# Patient Record
Sex: Male | Born: 1995 | Race: White | Hispanic: No | Marital: Single | Smoking: Never smoker
Health system: Southern US, Community
[De-identification: ages and names within clinical notes are randomized; demographics above are authoritative.]

---
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
## 2013-06-20 ENCOUNTER — Emergency Department
Admission: EM | Admit: 2013-06-20 | Discharge: 2013-06-20 | Disposition: A | Discharge status: Home / Self Care | Payer: BC Managed Care – PPO | Source: Home / Self Care | Attending: Family Medicine | Admitting: Family Medicine

## 2013-06-20 ENCOUNTER — Emergency Department (INDEPENDENT_AMBULATORY_CARE_PROVIDER_SITE_OTHER): Payer: BC Managed Care – PPO

## 2013-06-20 ENCOUNTER — Encounter: Payer: Self-pay | Admitting: Emergency Medicine

## 2013-06-20 ENCOUNTER — Ambulatory Visit (INDEPENDENT_AMBULATORY_CARE_PROVIDER_SITE_OTHER): Payer: BC Managed Care – PPO | Admitting: Sports Medicine

## 2013-06-20 DIAGNOSIS — S62304A Unspecified fracture of fourth metacarpal bone, right hand, initial encounter for closed fracture: Secondary | ICD-10-CM

## 2013-06-20 DIAGNOSIS — S62309A Unspecified fracture of unspecified metacarpal bone, initial encounter for closed fracture: Secondary | ICD-10-CM

## 2013-06-20 DIAGNOSIS — S62306A Unspecified fracture of fifth metacarpal bone, right hand, initial encounter for closed fracture: Secondary | ICD-10-CM | POA: Insufficient documentation

## 2013-06-20 MED ORDER — HYDROCODONE-ACETAMINOPHEN 10-325 MG PO TABS: 1.0000 | ORAL_TABLET | Freq: Three times a day (TID) | ORAL | Status: DC | PRN

## 2013-06-20 NOTE — ED Notes (Signed)
Rt hand injury yesterday slammed in a door 7/10 intermittent

## 2013-06-20 NOTE — Assessment & Plan Note (Signed)
Reduced under hematoma block. Ulnar gutter splint placed. Hydrocodone as needed for pain. Return on Monday, x-ray before visit.  I billed a fracture code for this visit, all subsequent visits for this complaint will be "post-op checks" in the global period.

## 2013-06-20 NOTE — Progress Notes (Signed)
   Subjective:    I'm seeing this patient as a consultation for:  Dr. BeCathren Harshese  CC: Hand fracture  HPI: This is a pleasant 18 year old male, earlier today he was at shop class, close his hand in a car door, resulting in immediate exquisite pain, swelling, and bruising. He was seen in urgent care where x-rays showed a angulated, displaced fracture of both the fourth and fifth metacarpals. I was called for further evaluation and treatment.  Past medical history, Surgical history, Family history not pertinant except as noted below, Social history, Allergies, and medications have been entered into the medical record, reviewed, and no changes needed.   Review of Systems: No headache, visual changes, nausea, vomiting, diarrhea, constipation, dizziness, abdominal pain, skin rash, fevers, chills, night sweats, weight loss, swollen lymph nodes, body aches, joint swelling, muscle aches, chest pain, shortness of breath, mood changes, visual or auditory hallucinations.   Objective:   General: Well Developed, well nourished, and in no acute distress.  Neuro/Psych: Alert and oriented x3, extra-ocular muscles intact, able to move all 4 extremities, sensation grossly intact. Skin: Warm and dry, no rashes noted.  Respiratory: Not using accessory muscles, speaking in full sentences, trachea midline.  Cardiovascular: Pulses palpable, no extremity edema. Abdomen: Does not appear distended. Right hand: There is diffuse swelling and bruising, there is discrete tenderness to palpation over the fractures. He is neurovascularly intact distally.  Procedure:  Fracture reduction of both the fourth and fifth metacarpal fractures. Risks, benefits, and alternatives explained and consent obtained. Time out conducted. Surface cleaned with alcohol. 2.5 cc lidocaine, 2.5 cc Marcaine infiltrated around both the fourth and fifth metacarpal fractures in a hematoma block, this was done under ultrasound guidance. Adequate  anesthesia ensured. Fracture reduction procedure: We first produced the fifth metacarpal fracture, I reproduced increased angulation of the fracture, after which I plate axial traction, and then a forced directives from the dorsal metacarpal. Occult fracture slipped back into place. Attention was then directed to the fourth metacarpal fracture, again, I increased the angulation of the fracture, applied axial traction, and then apply at dorsal force and felt the fracture slip back into place. I then applied an ulnar gutter splint. Pt stable. Postreduction films reviewed and noted improved/near anatomic alignment. Aftercare and follow-up advised.  Impression and Recommendations:   This case required medical decision making of moderate complexity.

## 2013-06-20 NOTE — ED Provider Notes (Signed)
CSN: [id number]     Arrival date & time 06/20/13  1737 History   First MD Initiated Contact with Patient 06/20/13 1759     Chief Complaint  Patient presents with  . Hand Injury     HPI Comments: Patient was in a shop class today when a car door slammed on his right hand.  He has had persistent pain/swelling in dorsum of hand.  Patient is a 18 y.o. male presenting with hand injury. The history is provided by the patient and a parent.  Hand Injury Location:  Hand Time since incident:  7 hours Injury: yes   Mechanism of injury: crush   Crush injury:    Mechanism:  Door   Duration of crushing force: brief. Hand location:  R hand Pain details:    Quality:  Aching   Radiates to:  Does not radiate   Severity:  Moderate   Onset quality:  Sudden   Duration:  7 hours   Timing:  Constant   Progression:  Unchanged Chronicity:  New Handedness:  Right-handed Dislocation: no   Foreign body present:  No foreign bodies Prior injury to area:  No Relieved by:  Nothing Worsened by:  Movement Ineffective treatments:  Ice Associated symptoms: decreased range of motion, stiffness and swelling   Associated symptoms: no muscle weakness, no numbness and no tingling     History reviewed. No pertinent past medical history. History reviewed. No pertinent past surgical history. No family history on file. History  Substance Use Topics  . Smoking status: Never Smoker   . Smokeless tobacco: Not on file  . Alcohol Use: No    Review of Systems  Musculoskeletal: Positive for stiffness.  All other systems reviewed and are negative.   Allergies  Review of patient's allergies indicates no known allergies.  Home Medications   Prior to Admission medications   Medication Sig Start Date End Date Taking? Authorizing Provider  acetaminophen (TYLENOL) 325 MG tablet Take 650 mg by mouth every 6 (six) hours as needed.   Yes Historical Provider, MD  ibuprofen (ADVIL,MOTRIN) 200 MG tablet Take 200 mg by  mouth every 6 (six) hours as needed.   Yes Historical Provider, MD   BP 118/77  Pulse 88  Temp(Src) 98 F (36.7 C) (Oral)  Ht 5\' 10"  (1.778 m)  Wt 146 lb (66.225 kg)  BMI 20.95 kg/m2  SpO2 99% Physical Exam  Nursing note and vitals reviewed. Constitutional: He is oriented to person, place, and time. He appears well-developed and well-nourished. No distress.  HENT:  Head: Atraumatic.  Eyes: Conjunctivae are normal. Pupils are equal, round, and reactive to light.  Musculoskeletal:       Right hand: He exhibits decreased range of motion, tenderness, bony tenderness and swelling. He exhibits normal two-point discrimination, normal capillary refill, no deformity and no laceration. Normal sensation noted.       Hands: Swelling dorsum of hand with maximal tenderness over 4th and 5th metacarpals.  Decreased range of motion fingers but flexion/extension is intact.  Distal neurovascular function is intact.   Neurological: He is alert and oriented to person, place, and time.  Skin: Skin is warm and dry.    ED Course  Procedures  none     Imaging Review Dg Hand Complete Right  06/20/2013   CLINICAL DATA:  Slammed right hand into a car door yesterday, pain at fourth and fifth metacarpals  EXAM: RIGHT HAND - COMPLETE 3+ VIEW  COMPARISON:  None  FINDINGS: Osseous mineralization  normal.  Joint spaces preserved.  Transverse fracture middle third of the fourth metacarpal, minimally displaced ulnar and dorsal with apex dorsal and ulnar angulation.  Displaced distal fifth metacarpal fracture with apex dorsal and ulnar angulation.  No intra or articular extension of either fracture.  Overlying soft tissue swelling.  No additional fracture, dislocation, or bone destruction.  IMPRESSION: Displaced right fourth and fifth metacarpal fractures as above.   Electronically Signed   By: Ulyses SouthwardMark  Boles M.D.   On: 06/20/2013 18:51     MDM   1. Closed fracture of fourth metacarpal bone of right hand   2. Closed  fracture of fifth metacarpal bone of right hand    Patient referred to Dr. Rodney Langtonhomas Thekkekandam for management and follow-up     Lattie HawStephen A Beese, MD 06/20/13 [id number]

## 2013-06-20 NOTE — Discharge Instructions (Signed)
Closed Reduction for Metacarpal Fracture or Dislocation Care After Refer to this sheet in the next few weeks. These instructions provide you with information on caring for yourself after your procedure. Your caregiver may also give you more specific instructions. Your treatment has been planned according to current medical practices, but problems sometimes occur. Call your caregiver if you have any problems or questions after your procedure. HOME CARE INSTRUCTIONS   Only take medicines as directed by your caregiver.  Keep the injured area elevated above the level of your heart while you are sleeping or sitting down.  Apply ice to the injured area, twice per day, for 2 3 days:  Put ice in a plastic bag.  Place a towel between your skin and the bag.  Leave the ice on for 15 minutes.  Keep your splint clean and dry. Cover your splint with a bag while you shower. SEEK MEDICAL CARE IF:  Your pain does not get better or gets worse.  You develop numbness or tingling.  Your fingers turn white or bluish. MAKE SURE YOU:  Understand these instructions.  Will watch your condition.  Will get help right away if you are not doing well or get worse. Document Released: 11/08/2011 Document Revised: 06/10/2012 Document Reviewed: 11/08/2011 [facility] Patient Information 2014 [place], [place].

## 2013-06-20 NOTE — Assessment & Plan Note (Addendum)
Reduced under hematoma block. Ulnar gutter splint placed. Hydrocodone as needed for pain. Return on Monday, x-ray before visit.  I billed a fracture code for this visit, all subsequent visits for this complaint will be "post-op checks" in the global period. 

## 2013-06-23 ENCOUNTER — Encounter: Payer: Self-pay | Admitting: Sports Medicine

## 2013-06-23 ENCOUNTER — Ambulatory Visit (INDEPENDENT_AMBULATORY_CARE_PROVIDER_SITE_OTHER): Payer: BC Managed Care – PPO | Admitting: Sports Medicine

## 2013-06-23 ENCOUNTER — Ambulatory Visit (INDEPENDENT_AMBULATORY_CARE_PROVIDER_SITE_OTHER): Payer: BC Managed Care – PPO

## 2013-06-23 VITALS — BP 132/87 | HR 120 | Ht 71.0 in | Wt 146.0 lb

## 2013-06-23 DIAGNOSIS — S62309A Unspecified fracture of unspecified metacarpal bone, initial encounter for closed fracture: Secondary | ICD-10-CM

## 2013-06-23 DIAGNOSIS — S62306A Unspecified fracture of fifth metacarpal bone, right hand, initial encounter for closed fracture: Secondary | ICD-10-CM

## 2013-06-23 DIAGNOSIS — S62304A Unspecified fracture of fourth metacarpal bone, right hand, initial encounter for closed fracture: Secondary | ICD-10-CM

## 2013-06-23 DIAGNOSIS — IMO0001 Reserved for inherently not codable concepts without codable children: Secondary | ICD-10-CM

## 2013-06-23 NOTE — Progress Notes (Signed)
  Subjective: Followup fourth and fifth metacarpal fractures, I performed a closed reduction 2 days ago. Narcotics controlled his pain well.   Objective: General: Well-developed, well-nourished, and in no acute distress.  Repeat x-rays did show loss of reduction.  Procedure:  Fracture reduction. Risks, benefits, and alternatives explained and consent obtained. Time out conducted. Surface cleaned with alcohol. 5cc lidocaine infiltrated around fracture in a hematoma block. Adequate anesthesia ensured. Fracture reduction procedure: Again, I applied axial traction to the fourth metacarpal head, I then applied a palmar directed force to reduce the fracture, this was repeated at the fifth metacarpal neck, an ulnar gutter splint was then applied Pt stable. Postreduction films reviewed and noted improved/near anatomic alignment. Aftercare and follow-up advised.  Assessment/plan:

## 2013-06-23 NOTE — Assessment & Plan Note (Addendum)
Unfortunately there has been a slight loss of reduction in the fracture, repeat reduction and ulnar gutter splint. Repeat x-rays today. Return on Wednesday for repeat x-rays. If continued loss of reduction we will refer for ORIF.

## 2013-06-23 NOTE — Assessment & Plan Note (Addendum)
Unfortunately there has been a slight loss of reduction in the fracture, repeat reduction and ulnar gutter splint. Repeat x-rays after second reduction today. Return on Wednesday for repeat x-rays. If continued loss of reduction we will refer for ORIF.

## 2013-06-25 ENCOUNTER — Encounter: Payer: Self-pay | Admitting: Sports Medicine

## 2013-06-25 ENCOUNTER — Ambulatory Visit (INDEPENDENT_AMBULATORY_CARE_PROVIDER_SITE_OTHER): Payer: BC Managed Care – PPO | Admitting: Sports Medicine

## 2013-06-25 ENCOUNTER — Ambulatory Visit (INDEPENDENT_AMBULATORY_CARE_PROVIDER_SITE_OTHER): Payer: BC Managed Care – PPO

## 2013-06-25 VITALS — BP 144/93 | HR 109 | Wt 146.0 lb

## 2013-06-25 DIAGNOSIS — IMO0001 Reserved for inherently not codable concepts without codable children: Secondary | ICD-10-CM

## 2013-06-25 DIAGNOSIS — S62306A Unspecified fracture of fifth metacarpal bone, right hand, initial encounter for closed fracture: Secondary | ICD-10-CM

## 2013-06-25 DIAGNOSIS — S62309A Unspecified fracture of unspecified metacarpal bone, initial encounter for closed fracture: Secondary | ICD-10-CM

## 2013-06-25 DIAGNOSIS — S62304A Unspecified fracture of fourth metacarpal bone, right hand, initial encounter for closed fracture: Secondary | ICD-10-CM

## 2013-06-25 NOTE — Progress Notes (Signed)
  Subjective: Returns a few days post fracture, pain-free.   Objective: General: Well-developed, well-nourished, and in no acute distress. Right hand: Splint was left on, he is neurovascularly intact distally.  X-rays are reviewed, there is continued stability of the fractures, there is approximately 30 of residual angulation of the fifth metacarpal neck, and 20 of residual angulation of the fourth metacarpal shaft.  Assessment/plan:

## 2013-06-25 NOTE — Assessment & Plan Note (Signed)
5 days post fracture. Reduction is maintained. Still too much swelling to place a cast. Pain control is good. Return to see me in a week and a half. X-rays before visit.

## 2013-06-25 NOTE — Assessment & Plan Note (Signed)
5 days post fracture. Reduction is maintained. Still too much swelling to place a cast. Pain control is good. Return to see me in a week and a half. X-rays before visit.  

## 2013-07-07 ENCOUNTER — Ambulatory Visit (INDEPENDENT_AMBULATORY_CARE_PROVIDER_SITE_OTHER): Payer: BC Managed Care – PPO | Admitting: Sports Medicine

## 2013-07-07 ENCOUNTER — Ambulatory Visit (INDEPENDENT_AMBULATORY_CARE_PROVIDER_SITE_OTHER): Payer: BC Managed Care – PPO

## 2013-07-07 ENCOUNTER — Encounter: Payer: Self-pay | Admitting: Sports Medicine

## 2013-07-07 VITALS — BP 121/84 | HR 105 | Ht 70.0 in | Wt 142.0 lb

## 2013-07-07 DIAGNOSIS — S62306A Unspecified fracture of fifth metacarpal bone, right hand, initial encounter for closed fracture: Secondary | ICD-10-CM

## 2013-07-07 DIAGNOSIS — S62309A Unspecified fracture of unspecified metacarpal bone, initial encounter for closed fracture: Secondary | ICD-10-CM

## 2013-07-07 DIAGNOSIS — S62304A Unspecified fracture of fourth metacarpal bone, right hand, initial encounter for closed fracture: Secondary | ICD-10-CM

## 2013-07-07 DIAGNOSIS — IMO0001 Reserved for inherently not codable concepts without codable children: Secondary | ICD-10-CM

## 2013-07-07 NOTE — Assessment & Plan Note (Signed)
Continue ulnar gutter splint for an additional 2 weeks. Return in 2 weeks x-ray before visit.

## 2013-07-07 NOTE — Assessment & Plan Note (Signed)
Continue ulnar gutter splint for an additional 2 weeks. Return in 2 weeks x-ray before visit. 

## 2013-07-07 NOTE — Progress Notes (Signed)
  Subjective: 18 days post fracture of the fourth and fifth metacarpals, essentially pain-free at this point, continuing ulnar gutter splint. Patient continues to endorse he wouldn't want surgery.   Objective: General: Well-developed, well-nourished, and in no acute distress. Fifth metacarpal is nontender, mild tenderness over the fourth metacarpal fracture, I still can feel some movement between the fracture fragments.  X-rays reviewed, alignment is stable with mild residual apex dorsal angulation of the fourth metacarpal fracture. There is now some bony callus visible.  Assessment/plan:

## 2013-07-22 ENCOUNTER — Ambulatory Visit (INDEPENDENT_AMBULATORY_CARE_PROVIDER_SITE_OTHER): Payer: BC Managed Care – PPO

## 2013-07-22 ENCOUNTER — Ambulatory Visit (INDEPENDENT_AMBULATORY_CARE_PROVIDER_SITE_OTHER): Payer: BC Managed Care – PPO | Admitting: Sports Medicine

## 2013-07-22 ENCOUNTER — Encounter: Payer: Self-pay | Admitting: Sports Medicine

## 2013-07-22 VITALS — BP 125/78 | HR 84 | Ht 70.0 in | Wt 141.0 lb

## 2013-07-22 DIAGNOSIS — S62304A Unspecified fracture of fourth metacarpal bone, right hand, initial encounter for closed fracture: Secondary | ICD-10-CM

## 2013-07-22 DIAGNOSIS — IMO0001 Reserved for inherently not codable concepts without codable children: Secondary | ICD-10-CM

## 2013-07-22 DIAGNOSIS — S62306A Unspecified fracture of fifth metacarpal bone, right hand, initial encounter for closed fracture: Secondary | ICD-10-CM

## 2013-07-22 DIAGNOSIS — S62309A Unspecified fracture of unspecified metacarpal bone, initial encounter for closed fracture: Secondary | ICD-10-CM

## 2013-07-22 NOTE — Assessment & Plan Note (Addendum)
There still is moderate angulation that at 21 without rotational deformity, is within acceptable limits. He has significant tightness at the fourth metacarpophalangeal joint as expected after prolonged immobilization. I do not feel any movement at the actual fracture suggesting solid union. X-rays do show fantastic bony callus. At this point we are going to proceed with formal physical therapy to help him regain strength and range of motion. Velcro brace which he has at home. Return in 4 weeks.

## 2013-07-22 NOTE — Progress Notes (Addendum)
  Subjective: One month post fracture of the right fourth metacarpal and the right fifth metacarpal bones. We did reduce the fracture but he did have some residual angulation, apex dorsal of the fourth metacarpal fracture. He has been in an ulnar gutter splint. He is eager to get out of the splint, he really has no pain at the fracture site.   Objective: General: Well-developed, well-nourished, and in no acute distress. Right hand: Fracture deformity is still palpable, he has good motion of the proximal and distal interphalangeal joints, he still lacks significant motion in his fourth metacarpophalangeal joint, passively I can bring it to about 20 of flexion. There is no tenderness over the fracture site.  X-ray reviewed and shows good bone callus with residual, approximately 21 of angulation, apex dorsal of the fourth metacarpal.  Assessment/plan:

## 2013-07-22 NOTE — Assessment & Plan Note (Signed)
Clinically resolved.  

## 2013-07-28 ENCOUNTER — Ambulatory Visit: Payer: BC Managed Care – PPO | Admitting: Physical Therapy

## 2013-07-31 ENCOUNTER — Ambulatory Visit: Payer: BC Managed Care – PPO | Admitting: Physical Therapy

## 2013-08-05 ENCOUNTER — Telehealth: Payer: Self-pay | Admitting: Sports Medicine

## 2013-08-05 NOTE — Telephone Encounter (Signed)
PLEASE SEE NOTE BELOW. Bellamia Ferch,CMA  

## 2013-08-05 NOTE — Telephone Encounter (Signed)
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 Pt states he needs letter as soon as possible he is trying to get job. Please call when ready [phone]. Thanks

## 2013-08-05 NOTE — Telephone Encounter (Signed)
Patient has been informed. Edwin Glass,CMA  

## 2013-08-05 NOTE — Telephone Encounter (Signed)
Letter written, does he think he is safe to drive, and has he regained some range of motion with physical therapy?

## 2013-08-19 ENCOUNTER — Ambulatory Visit: Payer: BC Managed Care – PPO | Admitting: Sports Medicine

## 2013-08-19 DIAGNOSIS — Z0289 Encounter for other administrative examinations: Secondary | ICD-10-CM

## 2014-04-15 ENCOUNTER — Ambulatory Visit (INDEPENDENT_AMBULATORY_CARE_PROVIDER_SITE_OTHER): Payer: 59 | Admitting: Physician Assistant

## 2014-04-15 VITALS — BP 122/68 | HR 123 | Temp 99.3°F | Resp 16 | Ht 69.0 in | Wt 145.2 lb

## 2014-04-15 DIAGNOSIS — J029 Acute pharyngitis, unspecified: Secondary | ICD-10-CM

## 2014-04-15 LAB — POCT RAPID STREP A (OFFICE): Rapid Strep A Screen: NEGATIVE

## 2014-04-15 MED ORDER — AMOXICILLIN 500 MG PO CAPS: 500.0000 mg | ORAL_CAPSULE | Freq: Two times a day (BID) | ORAL | Status: AC

## 2014-04-15 NOTE — Addendum Note (Signed)
Addended by: Bronson CurbPOE, Subhan Hoopes C on: 04/15/2014 07:09 PM   Modules accepted: Orders

## 2014-04-15 NOTE — Progress Notes (Signed)
Subjective:    Patient ID: Edwin Glass, male    DOB: 10-08-1995, 19 y.o.   MRN: [id number]  Chief Complaint  Patient presents with  . Sore Throat    3 days   Prior to Admission medications   Medication Sig Start Date End Date Taking? Authorizing Provider  acetaminophen (TYLENOL) 325 MG tablet Take 650 mg by mouth every 6 (six) hours as needed.    Historical Provider, MD  ibuprofen (ADVIL,MOTRIN) 200 MG tablet Take 200 mg by mouth every 6 (six) hours as needed.    Historical Provider, MD   Medications, allergies, past medical history, surgical history, family history, social history and problem list reviewed and updated.  HPI  19 yom with no significant pmh presents with sore throat.  Felt somewhat bad 2 nights ago with chills and mild st. Yest morning awoke and had severe st. Felt hot and had chills throughout the day. Slept most of day yest. Today the st is very slightly improved but still severe. Denies cough, rhinorrhea, congestion, n/v, diarrhea. Has had mild bilateral otalgia.   Took aleve prior to arrival today. Denies muffled voice, drooling, hoarseness, trouble swallowing.   Elevated HR noted today. Vitals otherwise normal. Pt states this is normal for him due to his anxiety. He has been on meds and had counseling in the past. He is not interested in either of those at this time but states he feels anxious in the room today.   Review of Systems No cp, sob. No neck pain.     Objective:   Physical Exam  Constitutional: He is oriented to person, place, and time. He appears well-developed and well-nourished.  Non-toxic appearance. He does not have a sickly appearance. He appears ill. No distress.  BP 122/68 mmHg  Pulse 123  Temp(Src) 99.3 F (37.4 C) (Oral)  Resp 16  Ht  (1.753 m)  Wt 145 lb 3.2 oz (65.862 kg)  BMI 21.43 kg/m2  SpO2 98%   HENT:  Right Ear: Tympanic membrane is not erythematous. A middle ear effusion is present.  Left Ear: Tympanic membrane is not  erythematous.  No middle ear effusion.  Nose: Nose normal. Right sinus exhibits no maxillary sinus tenderness and no frontal sinus tenderness. Left sinus exhibits no maxillary sinus tenderness and no frontal sinus tenderness.  Mouth/Throat: Uvula is midline. No uvula swelling. Oropharyngeal exudate and posterior oropharyngeal erythema present.    Erythema across soft palate. Few exudates soft palate. Left tonsil swollen with numerous exudates.   Neck: No Brudzinski's sign noted.  Pulmonary/Chest: Effort normal and breath sounds normal. He has no decreased breath sounds. He has no wheezes. He has no rhonchi. He has no rales.  Lymphadenopathy:       Head (right side): Submandibular adenopathy present. No submental and no tonsillar adenopathy present.       Head (left side): Submandibular adenopathy present. No submental and no tonsillar adenopathy present.    He has cervical adenopathy.       Right cervical: Superficial cervical adenopathy present. No posterior cervical adenopathy present.      Left cervical: Superficial cervical adenopathy present. No posterior cervical adenopathy present.  Neurological: He is alert and oriented to person, place, and time.   Results for orders placed or performed in visit on 04/15/14  POCT rapid strep A  Result Value Ref Range   Rapid Strep A Screen Negative Negative      Assessment & Plan:   19 yom with no significant  pmh presents with sore throat.  Sore throat - Plan: POCT rapid strep A, amoxicillin (AMOXIL) 500 MG capsule --swab neg, cx sent --will tx for likely strep at this time with 4/4 centor criteria (exudates, fever, no cough, cervial lan)  --rtc if not better 4-5 days, ER if worsening or signs of trouble breathing/swallowing  Donnajean Lopesodd M. Vilda Zollner, PA-C Physician Assistant-Certified [facility]  04/15/2014 6:45 PM

## 2014-04-15 NOTE — Patient Instructions (Signed)
Your strep swab was negative. This is not 100% accurate so we will wait to see what the culture shows over the next few days. I'm treating you with an antibiotic anyway for likely strep infection.  Please take the amoxicillin twice daily for 10 days. Please return to clinic if you're not feeling better in 4-5 days. Please come back sooner or go to the ED if you start to feel worse.

## 2014-04-18 LAB — CULTURE, GROUP A STREP: ORGANISM ID, BACTERIA: NORMAL

## 2015-10-04 IMAGING — CR DG HAND COMPLETE 3+V*R*
3 series · 3 of 3 positions shown · non-contrast
Comparison: None

CLINICAL DATA: Slammed right hand into a car door yesterday, pain
at fourth and fifth metacarpals

EXAM:
RIGHT HAND - COMPLETE 3+ VIEW

[view not recorded (1 of 3)]
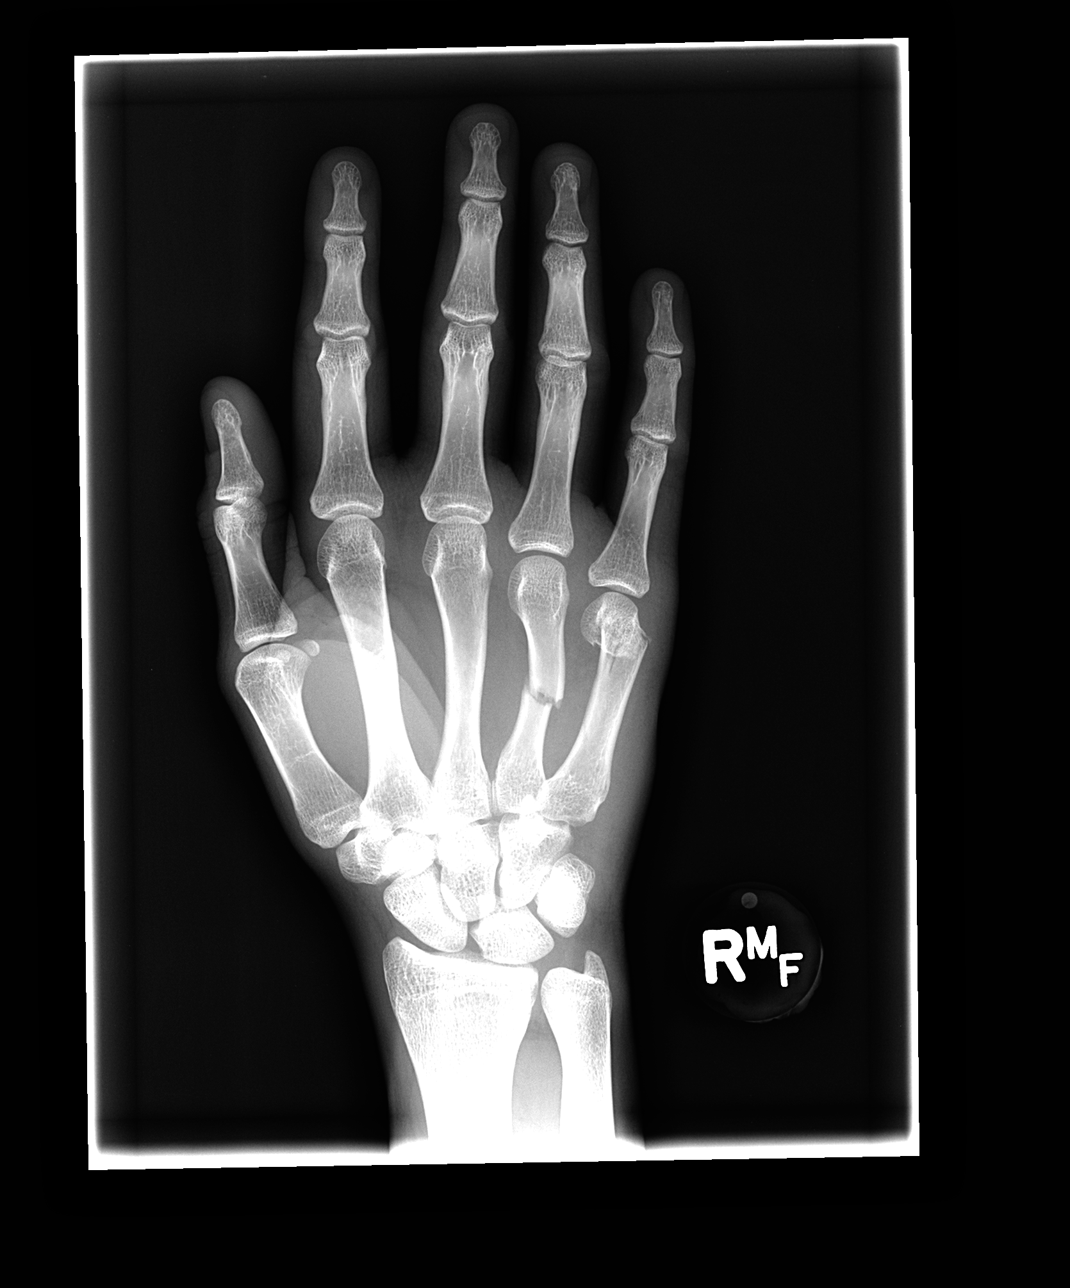

[view not recorded (2 of 3)]
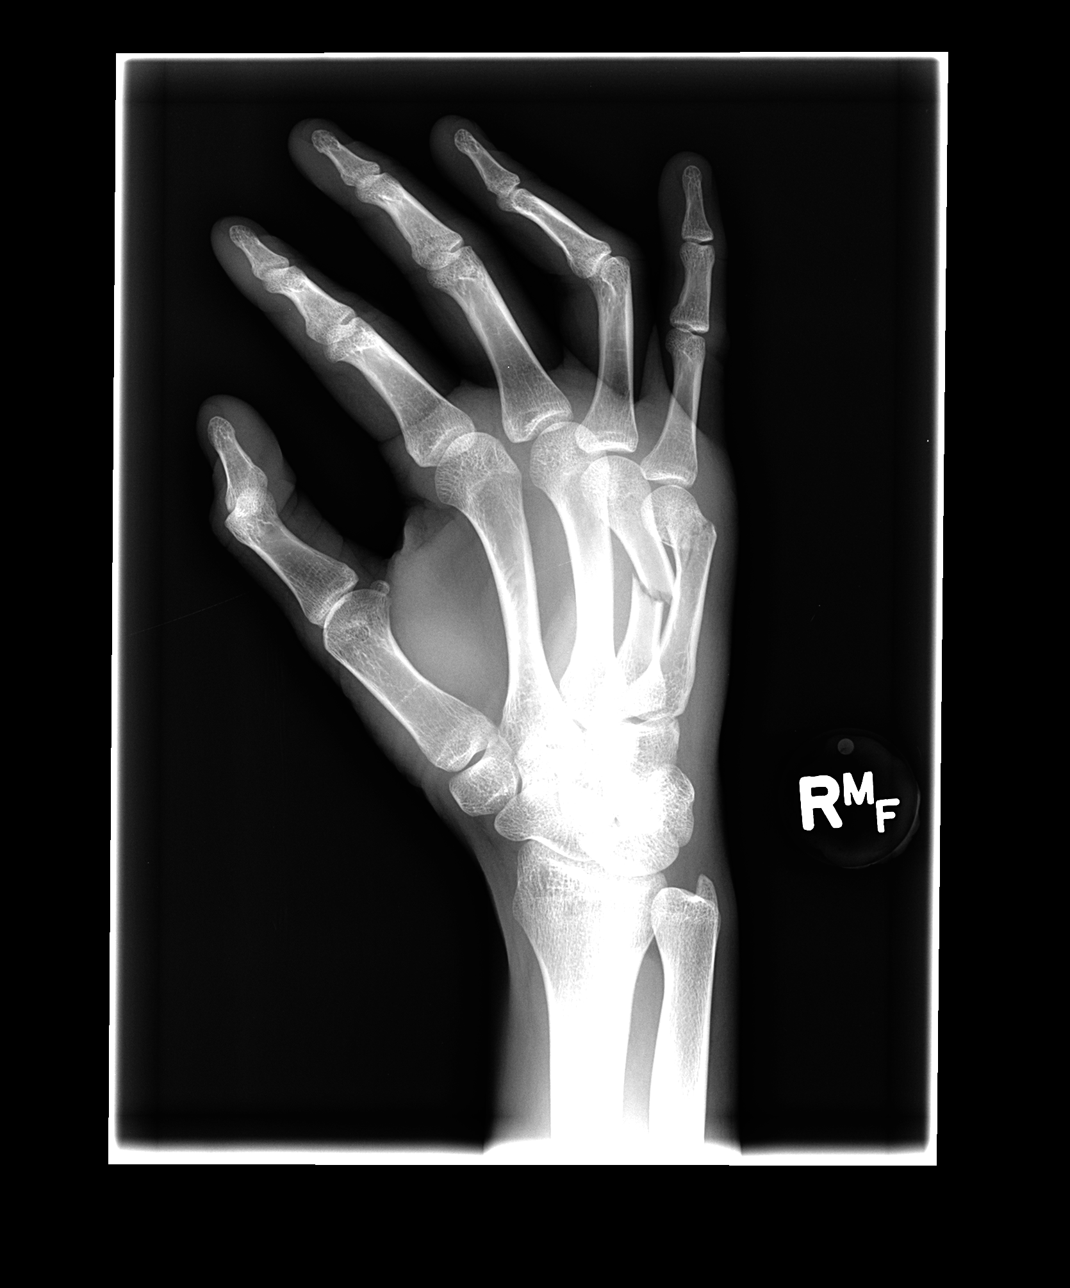

[view not recorded (3 of 3)]
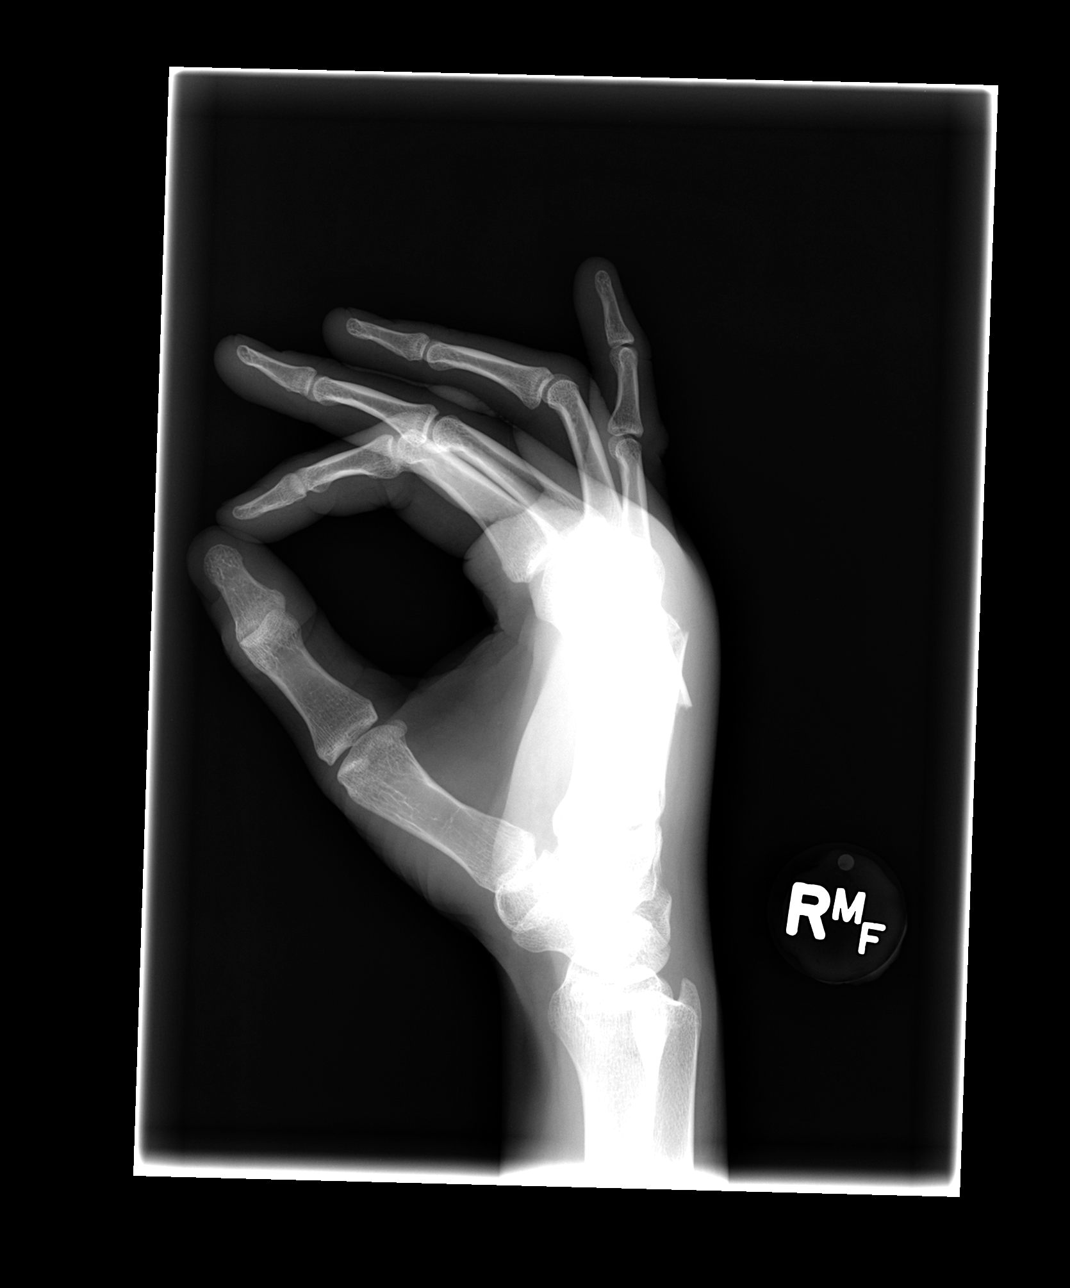

[3 of 3 positions shown; findings below may reference images not displayed]

FINDINGS: Osseous mineralization normal.

Joint spaces preserved.

Transverse fracture middle third of the fourth metacarpal, minimally
displaced ulnar and dorsal with apex dorsal and ulnar angulation.

Displaced distal fifth metacarpal fracture with apex dorsal and
ulnar angulation.

No intra or articular extension of either fracture.

Overlying soft tissue swelling.

No additional fracture, dislocation, or bone destruction.
IMPRESSION: Displaced right fourth and fifth metacarpal fractures as above.
